# Patient Record
Sex: Female | Born: 1980 | Race: Black or African American | Hispanic: No | Marital: Single | Smoking: Never smoker
Health system: Southern US, Community
[De-identification: ages and names within clinical notes are randomized; demographics above are authoritative.]

## PROBLEM LIST (undated history)

## (undated) DIAGNOSIS — I10 Essential (primary) hypertension: Secondary | ICD-10-CM

## (undated) DIAGNOSIS — R011 Cardiac murmur, unspecified: Secondary | ICD-10-CM

## (undated) HISTORY — DX: Cardiac murmur, unspecified: R01.1

## (undated) HISTORY — PX: GASTRIC BYPASS: SHX52

## (undated) HISTORY — PX: LAPAROSCOPIC GASTRIC SLEEVE RESECTION: SHX5895

---

## 2011-03-20 ENCOUNTER — Emergency Department (HOSPITAL_BASED_OUTPATIENT_CLINIC_OR_DEPARTMENT_OTHER): Payer: Self-pay

## 2011-03-20 ENCOUNTER — Emergency Department (INDEPENDENT_AMBULATORY_CARE_PROVIDER_SITE_OTHER): Payer: Self-pay

## 2011-03-20 ENCOUNTER — Emergency Department (HOSPITAL_BASED_OUTPATIENT_CLINIC_OR_DEPARTMENT_OTHER)
Admission: EM | Admit: 2011-03-20 | Discharge: 2011-03-21 | Disposition: A | Discharge status: Home / Self Care | Payer: Self-pay | Attending: Emergency Medicine | Admitting: Emergency Medicine

## 2011-03-20 ENCOUNTER — Emergency Department (HOSPITAL_COMMUNITY): Payer: Self-pay

## 2011-03-20 DIAGNOSIS — R079 Chest pain, unspecified: Secondary | ICD-10-CM

## 2011-03-20 LAB — DIFFERENTIAL
Basophils Relative: 0 % (ref 0–1)
Eosinophils Absolute: 0.4 10*3/uL (ref 0.0–0.7)
Eosinophils Relative: 4 % (ref 0–5)
Lymphs Abs: 3.2 10*3/uL (ref 0.7–4.0)
Monocytes Absolute: 0.6 10*3/uL (ref 0.1–1.0)
Monocytes Relative: 7 % (ref 3–12)
Neutrophils Relative %: 49 % (ref 43–77)

## 2011-03-20 LAB — CBC
MCH: 26.6 pg (ref 26.0–34.0)
MCHC: 32.8 g/dL (ref 30.0–36.0)
MCV: 81 fL (ref 78.0–100.0)
Platelets: 341 10*3/uL (ref 150–400)
RDW: 12.8 % (ref 11.5–15.5)

## 2011-03-20 LAB — BASIC METABOLIC PANEL
BUN: 12 mg/dL (ref 6–23)
CO2: 29 mEq/L (ref 19–32)
Calcium: 9 mg/dL (ref 8.4–10.5)
GFR calc non Af Amer: >60 mL/min (ref >60)
Glucose, Bld: 91 mg/dL (ref 70–99)
Sodium: 142 mEq/L (ref 135–145)

## 2011-03-20 LAB — POCT CARDIAC MARKERS
CKMB, poc: <1 ng/mL — ABNORMAL LOW (ref 1.0–8.0)
Troponin i, poc: <0.05 ng/mL (ref 0.00–0.09)

## 2011-03-20 LAB — PREGNANCY, URINE: Preg Test, Ur: NEGATIVE

## 2011-03-21 LAB — POCT CARDIAC MARKERS
CKMB, poc: <1 ng/mL — ABNORMAL LOW (ref 1.0–8.0)
Myoglobin, poc: 45.6 ng/mL (ref 12–200)
Troponin i, poc: <0.05 ng/mL (ref 0.00–0.09)

## 2012-03-10 ENCOUNTER — Encounter (HOSPITAL_BASED_OUTPATIENT_CLINIC_OR_DEPARTMENT_OTHER): Payer: Self-pay | Admitting: *Deleted

## 2012-03-10 ENCOUNTER — Emergency Department (HOSPITAL_BASED_OUTPATIENT_CLINIC_OR_DEPARTMENT_OTHER)
Admission: EM | Admit: 2012-03-10 | Discharge: 2012-03-11 | Disposition: A | Discharge status: Home / Self Care | Payer: Self-pay | Attending: Emergency Medicine | Admitting: Emergency Medicine

## 2012-03-10 DIAGNOSIS — Z9884 Bariatric surgery status: Secondary | ICD-10-CM | POA: Insufficient documentation

## 2012-03-10 DIAGNOSIS — R109 Unspecified abdominal pain: Secondary | ICD-10-CM | POA: Insufficient documentation

## 2012-03-10 DIAGNOSIS — R079 Chest pain, unspecified: Secondary | ICD-10-CM | POA: Insufficient documentation

## 2012-03-10 DIAGNOSIS — R0602 Shortness of breath: Secondary | ICD-10-CM | POA: Insufficient documentation

## 2012-03-10 LAB — URINE MICROSCOPIC-ADD ON

## 2012-03-10 LAB — URINALYSIS, ROUTINE W REFLEX MICROSCOPIC
Glucose, UA: NEGATIVE mg/dL
Ketones, ur: >80 mg/dL — AB
Nitrite: NEGATIVE
Protein, ur: 30 mg/dL — AB
Specific Gravity, Urine: 1.018 (ref 1.005–1.030)
Urobilinogen, UA: 4 mg/dL — ABNORMAL HIGH (ref 0.0–1.0)
pH: 6.5 (ref 5.0–8.0)

## 2012-03-10 NOTE — ED Notes (Signed)
No resp. Distress noted in Pt.

## 2012-03-10 NOTE — ED Notes (Signed)
Sob x 1 week. Gastric bypass April 9th. Mid to upper back pain. Hematuria today. Feels cold. Denies fever or chills.

## 2012-03-10 NOTE — ED Provider Notes (Signed)
This chart was scribed for Cyndra Numbers, MD by Wallis Mart. The patient was seen in room MH11/MH11 and the patient's care was started at 11:52 PM.   CSN: [id number]  Arrival date & time 03/10/12  2202   First MD Initiated Contact with Patient 03/10/12 2334      No chief complaint on file.   (Consider location/radiation/quality/duration/timing/severity/associated sxs/prior treatment) HPI Colleen Moody is a 31 y.o. female who presents to the Emergency Department complaining of sudden onset, intermittent, gradually worsening SOB onset 1 week ago.  Pt had gastric bypass one week ago and the SOB started after. Pt c/o associated pain across her chest yesterday but states that it went away today.Pt c/o hematuria onset today.  Pt c/o associated upper  back pain. Pt c/o chills on her right side.  Pt denies h/o kidney stones. Pt denies coughing, fevers.  Denies h/o blood clots, kidney problems, use of blood thinners. There are no other associated symptoms and no other alleviating or aggravating factors.   History reviewed. No pertinent past medical history.  Past Surgical History  Procedure Date  . Gastric bypass     No family history on file.  History  Substance Use Topics  . Smoking status: Never Smoker   . Smokeless tobacco: Not on file  . Alcohol Use: Yes    OB History    Grav Para Term Preterm Abortions TAB SAB Ect Mult Living                  Review of Systems  Constitutional: Positive for activity change.  HENT: Negative.   Eyes: Negative.   Respiratory: Positive for shortness of breath. Negative for cough.   Cardiovascular: Positive for chest pain.  Gastrointestinal: Positive for abdominal pain.  Genitourinary: Positive for hematuria.       Patient has significant difficulty telling me whether she is having her menstrual period or not but eventually feels that the blood in her urine is menstrual in nature.  Musculoskeletal: Negative.   Skin: Positive for wound.    Neurological: Negative.   Hematological: Negative.   Psychiatric/Behavioral: Negative.   All other systems reviewed and are negative.    10 Systems reviewed and all are negative for acute change except as noted in the HPI.   Allergies  Review of patient's allergies indicates not on file.  Home Medications  No current outpatient prescriptions on file.  BP 150/88  Pulse 101  Temp(Src) 99.1 F (37.3 C) (Oral)  Resp 22  SpO2 100%  Physical Exam  Nursing note and vitals reviewed. Constitutional: She is oriented to person, place, and time. She appears well-developed and well-nourished. No distress.  HENT:  Head: Normocephalic and atraumatic.  Eyes: EOM are normal. Pupils are equal, round, and reactive to light.  Neck: Neck supple. No tracheal deviation present.  Cardiovascular: Normal rate and regular rhythm.   Pulmonary/Chest: Effort normal and breath sounds normal. No respiratory distress.  Abdominal: Soft. Bowel sounds are normal. She exhibits no distension.       laparoscopic scars, clean and dry, in-tact, diffuse and mild tenderness consistent with recent postoperative state  Musculoskeletal: Normal range of motion. She exhibits no edema.       c8 back tenderness  Neurological: She is alert and oriented to person, place, and time. No cranial nerve deficit or sensory deficit. She exhibits normal muscle tone. Coordination normal.  Skin: Skin is warm and dry.  Psychiatric: She has a normal mood and affect. Her behavior is normal.  ED Course  Procedures (including critical care time) DIAGNOSTIC STUDIES: Oxygen Saturation is 100% on room air, normal by my interpretation.     Date: 03/11/2012  Rate: 79  Rhythm: normal sinus rhythm  QRS Axis: normal  Intervals: normal  ST/T Wave abnormalities: normal  Conduction Disutrbances: none  Narrative Interpretation: unremarkable      COORDINATION OF CARE:    Labs Reviewed  URINALYSIS, ROUTINE W REFLEX MICROSCOPIC -  Abnormal; Notable for the following:    Color, Urine AMBER (*) BIOCHEMICALS MAY BE AFFECTED BY COLOR   Hgb urine dipstick LARGE (*)    Bilirubin Urine MODERATE (*)    Ketones, ur >80 (*)    Protein, ur 30 (*)    Urobilinogen, UA 4.0 (*)    Leukocytes, UA SMALL (*)    All other components within normal limits  URINE MICROSCOPIC-ADD ON - Abnormal; Notable for the following:    Bacteria, UA FEW (*)    All other components within normal limits  CBC - Abnormal; Notable for the following:    WBC 10.9 (*)    RBC 3.48 (*)    Hemoglobin 9.4 (*)    HCT 28.1 (*)    All other components within normal limits  DIFFERENTIAL - Abnormal; Notable for the following:    Monocytes Absolute 1.1 (*)    All other components within normal limits  PREGNANCY, URINE  BASIC METABOLIC PANEL   Dg Chest 2 View  03/11/2012  *RADIOLOGY REPORT*  Clinical Data: Shortness of breath.  CHEST - 2 VIEW  Comparison: 03/20/2011  Findings: Heart size is upper normal in stable.  Thoracic aorta and hilar contours are normal.  The trachea is midline.  The left costophrenic angle is blunted, a new finding.  There may be a retrocardiac opacity in the medial left lung base.  The right lung appears clear.  IMPRESSION:  Cannot exclude atelectasis or early airspace disease of the left lung base.  Given blunting of the left costophrenic angle on the frontal projection, a small left pleural effusion cannot be excluded, but is not definite.  Original Report Authenticated By: Britta Mccreedy, M.D.   Ct Angio Chest W/cm &/or Wo Cm  03/11/2012  *RADIOLOGY REPORT*  Clinical Data: Shortness of breath and chest pain; recent gastric bypass surgery.  CT ANGIOGRAPHY CHEST  Technique:  Multidetector CT imaging of the chest using the standard protocol during bolus administration of intravenous contrast. Multiplanar reconstructed images including MIPs were obtained and reviewed to evaluate the vascular anatomy.  Contrast: 80mL OMNIPAQUE IOHEXOL 350 MG/ML  SOLN  Comparison: Chest radiograph performed earlier today at 12:32 a.m.  Findings: There is no evidence of significant pulmonary embolus. Evaluation for pulmonary embolus is suboptimal due to motion artifact and in areas of airspace consolidation.  There is patchy airspace consolidation within the left lower lobe, raising concern for mild pneumonia, and mild atelectasis at the right lower lobe.  Alternatively, this could all reflect atelectasis.  A trace left-sided pleural effusion is seen.  There is no evidence of pneumothorax.  No masses are identified; no abnormal focal contrast enhancement is seen.  The mediastinum is unremarkable in appearance.  No mediastinal lymphadenopathy is seen.  No pericardial effusion identified.  The great vessels are grossly unremarkable in appearance.  No axillary lymphadenopathy is seen.  The visualized portions of the thyroid gland are unremarkable in appearance.  The visualized portions of the liver and spleen are unremarkable.  No acute osseous abnormalities are seen.  IMPRESSION:  1.  No evidence of significant pulmonary embolus. 2.  Patchy airspace consolidation within the left lower lobe, raising concern for mild pneumonia; mild atelectasis at the right lower lobe.  Alternatively, this could all reflect atelectasis. 3.  Trace left-sided pleural effusion seen.  Original Report Authenticated By: Tonia Ghent, M.D.     1. Shortness of breath       MDM  Patient was evaluated by myself. She did have recent surgery and to be at risk for development of pneumonia due to poor respiratory efforts from pain, recent hospitalization. Patient also is at risk for blood clot do to recent surgery. She has no history of this. Patient initially complained of hematuria but was able to report that this was menstrual after returning to the bathroom and checking. Patient did not feel that further evaluation for this is necessary. Patient did have chest x-ray was concerning for possible  left lower lobe airspace disease. CT and geode showed concerning area once again with no signs of pulmonary embolus. Patient denied cough or fevers. I was comfortable with treating her possible infiltrate with Avelox. She was given a dose here and a ten-day course. Patient was again encouraged to use her incentive spirometer at home more frequently. She is only been using it every 2 hours. She was told to use it up to 5-6 times an hour while awake. I do think that atelectasis is somewhat responsible for patient's chest x-ray and CT findings. Patient will followup with her surgeon as previously scheduled. She is welcome to return if she has any other emergent concerns.   I personally performed the services described in this documentation, which was scribed in my presence. The recorded information has been reviewed and considered.            Cyndra Numbers, MD 03/11/12 [id number]

## 2012-03-11 ENCOUNTER — Other Ambulatory Visit (HOSPITAL_BASED_OUTPATIENT_CLINIC_OR_DEPARTMENT_OTHER): Payer: Self-pay

## 2012-03-11 ENCOUNTER — Emergency Department (INDEPENDENT_AMBULATORY_CARE_PROVIDER_SITE_OTHER): Payer: Self-pay

## 2012-03-11 DIAGNOSIS — R918 Other nonspecific abnormal finding of lung field: Secondary | ICD-10-CM

## 2012-03-11 DIAGNOSIS — R0602 Shortness of breath: Secondary | ICD-10-CM

## 2012-03-11 DIAGNOSIS — J9819 Other pulmonary collapse: Secondary | ICD-10-CM

## 2012-03-11 DIAGNOSIS — J90 Pleural effusion, not elsewhere classified: Secondary | ICD-10-CM

## 2012-03-11 LAB — BASIC METABOLIC PANEL
BUN: 9 mg/dL (ref 6–23)
CO2: 27 mEq/L (ref 19–32)
Calcium: 9.3 mg/dL (ref 8.4–10.5)
Chloride: 101 mEq/L (ref 96–112)
Creatinine, Ser: 0.7 mg/dL (ref 0.50–1.10)
Glucose, Bld: 99 mg/dL (ref 70–99)

## 2012-03-11 LAB — CBC
HCT: 28.1 % — ABNORMAL LOW (ref 36.0–46.0)
Hemoglobin: 9.4 g/dL — ABNORMAL LOW (ref 12.0–15.0)
MCV: 80.7 fL (ref 78.0–100.0)
WBC: 10.9 10*3/uL — ABNORMAL HIGH (ref 4.0–10.5)

## 2012-03-11 LAB — DIFFERENTIAL
Eosinophils Relative: 2 % (ref 0–5)
Lymphocytes Relative: 22 % (ref 12–46)
Lymphs Abs: 2.4 10*3/uL (ref 0.7–4.0)
Monocytes Absolute: 1.1 10*3/uL — ABNORMAL HIGH (ref 0.1–1.0)
Monocytes Relative: 10 % (ref 3–12)
Neutro Abs: 7.2 10*3/uL (ref 1.7–7.7)

## 2012-03-11 MED ORDER — IOHEXOL 350 MG/ML SOLN
80.0000 mL | Freq: Once | INTRAVENOUS | Status: AC | PRN
  Administered 2012-03-11: 80 mL via INTRAVENOUS

## 2012-03-11 MED ORDER — MOXIFLOXACIN HCL 400 MG PO TABS
400.0000 mg | ORAL_TABLET | Freq: Once | ORAL | Status: AC
  Administered 2012-03-11: 400 mg via ORAL
  Filled 2012-03-11: qty 1

## 2012-03-11 MED ORDER — MOXIFLOXACIN HCL 400 MG PO TABS: 400.0000 mg | ORAL_TABLET | Freq: Every day | ORAL | Status: AC

## 2012-03-11 MED ORDER — HYDROCODONE-ACETAMINOPHEN 5-325 MG PO TABS
2.0000 | ORAL_TABLET | Freq: Once | ORAL | Status: AC
  Administered 2012-03-11: 2 via ORAL
  Filled 2012-03-11: qty 2

## 2012-03-11 MED ORDER — MORPHINE SULFATE 4 MG/ML IJ SOLN: 4.0000 mg | Freq: Once | INTRAMUSCULAR | Status: DC

## 2012-03-11 NOTE — Discharge Instructions (Signed)
Shortness of Breath Shortness of breath (dyspnea) is the feeling of uneasy breathing. Shortness of breath does not always mean that there is a life-threatening illness. However, shortness of breath requires immediate medical care. CAUSES  Causes for shortness of breath include:  Not enough oxygen in the air (as with high altitudes or with a smoke-filled room).   Short-term (acute) lung disease, including:   Infections such as pneumonia.   Fluid in the lungs, such as heart failure.   A blood clot in the lungs (pulmonary embolism).   Lasting (chronic) lung diseases.   Heart disease (heart attack, angina, heart failure, and others).   Low red blood cells (anemia).   Poor physical fitness. This can cause shortness of breath when you exercise.   Chest or back injuries or stiffness.   Being overweight (obese).   Anxiety. This can make you feel like you are not getting enough air.  DIAGNOSIS  Serious medical problems can usually be found during your physical exam. Many tests may also be done to determine why you are having shortness of breath. Tests include:  Chest X-rays.   Lung function tests.   Blood tests.   Electrocardiography.   Exercise testing.   A cardiac echo.   Imaging scans.  Your caregiver may not be able to find a cause for your shortness of breath after your exam. In this case, it is important to have a follow-up exam with your caregiver as directed.  HOME CARE INSTRUCTIONS   Do not smoke. Smoking is a common cause of shortness of breath. Ask for help to stop smoking.   Avoid being around chemicals that may bother your breathing (paint fumes, dust).   Rest as needed. Slowly resume your usual activities.   If medicines were prescribed, take them as directed for the full length of time directed. This includes oxygen and any inhaled medicines.   Follow up with your caregiver as directed. Waiting to do so or failure to follow up could result in worsening of  your condition and possible disability or death.   Be sure you understand what to do or who to call if your shortness of breath worsens.  SEEK MEDICAL CARE IF:   Your condition does not improve in the time expected.   You have a hard time doing your normal activities even with rest.   You have any side effects or problems with the medicines prescribed.   You develop any new symptoms.  SEEK IMMEDIATE MEDICAL CARE IF:   Your shortness of breath is getting worse.   You feel lightheaded, faint, or develop a cough not controlled with medicines.   You start coughing up blood.   You have pain with breathing.   You have chest pain or pain in your arms, shoulders, or abdomen.   You have a fever.   You are unable to walk up stairs or exercise the way you normally do.   Your symptoms are getting worse.  Document Released: 08/07/2001 Document Revised: 11/01/2011 Document Reviewed: 03/24/2008 [employer] Patient Information 2012 [employer]. 

## 2012-03-11 NOTE — ED Notes (Signed)
Pt. Is in no distress with and IV place in the L FA.  Pt. Reports the IV "hurts".  NS flushed in IV site with no trouble.  Offered to take the IV out and Pt. Said "No".

## 2015-09-18 ENCOUNTER — Encounter (HOSPITAL_BASED_OUTPATIENT_CLINIC_OR_DEPARTMENT_OTHER): Payer: Self-pay | Admitting: *Deleted

## 2015-09-18 ENCOUNTER — Emergency Department (HOSPITAL_BASED_OUTPATIENT_CLINIC_OR_DEPARTMENT_OTHER)
Admission: EM | Admit: 2015-09-18 | Discharge: 2015-09-18 | Disposition: A | Discharge status: Home / Self Care | Payer: No Typology Code available for payment source | Attending: Emergency Medicine | Admitting: Emergency Medicine

## 2015-09-18 DIAGNOSIS — S199XXA Unspecified injury of neck, initial encounter: Secondary | ICD-10-CM | POA: Diagnosis not present

## 2015-09-18 DIAGNOSIS — M62838 Other muscle spasm: Secondary | ICD-10-CM | POA: Diagnosis not present

## 2015-09-18 DIAGNOSIS — Y92410 Unspecified street and highway as the place of occurrence of the external cause: Secondary | ICD-10-CM | POA: Insufficient documentation

## 2015-09-18 DIAGNOSIS — S3992XA Unspecified injury of lower back, initial encounter: Secondary | ICD-10-CM | POA: Diagnosis not present

## 2015-09-18 DIAGNOSIS — Y9389 Activity, other specified: Secondary | ICD-10-CM | POA: Insufficient documentation

## 2015-09-18 DIAGNOSIS — Y998 Other external cause status: Secondary | ICD-10-CM | POA: Diagnosis not present

## 2015-09-18 MED ORDER — CYCLOBENZAPRINE HCL 10 MG PO TABS: 10.0000 mg | ORAL_TABLET | Freq: Two times a day (BID) | ORAL | Status: DC | PRN

## 2015-09-18 NOTE — ED Notes (Signed)
Now c/o rt sided neck pain, also at lower back area.
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
## 2015-09-18 NOTE — Discharge Instructions (Signed)
Heat Therapy Heat therapy can help ease sore, stiff, injured, and tight muscles and joints. Heat relaxes your muscles, which may help ease your pain.  RISKS AND COMPLICATIONS If you have any of the following conditions, do not use heat therapy unless your health care provider has approved:  Poor circulation.  Healing wounds or scarred skin in the area being treated.  Diabetes, heart disease, or high blood pressure.  Not being able to feel (numbness) the area being treated.  Unusual swelling of the area being treated.  Active infections.  Blood clots.  Cancer.  Inability to communicate pain. This may include young children and people who have problems with their brain function (dementia).  Pregnancy. Heat therapy should only be used on old, pre-existing, or long-lasting (chronic) injuries. Do not use heat therapy on new injuries unless directed by your health care provider. HOW TO USE HEAT THERAPY There are several different kinds of heat therapy, including:  Moist heat pack.  Warm water bath.  Hot water bottle.  Electric heating pad.  Heated gel pack.  Heated wrap.  Electric heating pad. Use the heat therapy method suggested by your health care provider. Follow your health care provider's instructions on when and how to use heat therapy. GENERAL HEAT THERAPY RECOMMENDATIONS  Do not sleep while using heat therapy. Only use heat therapy while you are awake.  Your skin may turn pink while using heat therapy. Do not use heat therapy if your skin turns red.  Do not use heat therapy if you have new pain.  High heat or long exposure to heat can cause burns. Be careful when using heat therapy to avoid burning your skin.  Do not use heat therapy on areas of your skin that are already irritated, such as with a rash or sunburn. SEEK MEDICAL CARE IF:  You have blisters, redness, swelling, or numbness.  You have new pain.  Your pain is worse. MAKE SURE  YOU:  Understand these instructions.  Will watch your condition.  Will get help right away if you are not doing well or get worse.   This information is not intended to replace advice given to you by your health care provider. Make sure you discuss any questions you have with your health care provider.   Document Released: 02/04/2012 Document Revised: 12/03/2014 Document Reviewed: 01/05/2014 Elsevier Interactive Patient Education 2016 [employer].  Technical brewer It is common to have multiple bruises and sore muscles after a motor vehicle collision (MVC). These tend to feel worse for the first 24 hours. You may have the most stiffness and soreness over the first several hours. You may also feel worse when you wake up the first morning after your collision. After this point, you will usually begin to improve with each day. The speed of improvement often depends on the severity of the collision, the number of injuries, and the location and nature of these injuries. HOME CARE INSTRUCTIONS  Put ice on the injured area.  Put ice in a plastic bag.  Place a towel between your skin and the bag.  Leave the ice on for 15-20 minutes, 3-4 times a day, or as directed by your health care provider.  Drink enough fluids to keep your urine clear or pale yellow. Do not drink alcohol.  Take a warm shower or bath once or twice a day. This will increase blood flow to sore muscles.  You may return to activities as directed by your caregiver. Be careful when lifting,  as this may aggravate neck or back pain.  Only take over-the-counter or prescription medicines for pain, discomfort, or fever as directed by your caregiver. Do not use aspirin. This may increase bruising and bleeding. SEEK IMMEDIATE MEDICAL CARE IF:  You have numbness, tingling, or weakness in the arms or legs.  You develop severe headaches not relieved with medicine.  You have severe neck pain, especially tenderness in the  middle of the back of your neck.  You have changes in bowel or bladder control.  There is increasing pain in any area of the body.  You have shortness of breath, light-headedness, dizziness, or fainting.  You have chest pain.  You feel sick to your stomach (nauseous), throw up (vomit), or sweat.  You have increasing abdominal discomfort.  There is blood in your urine, stool, or vomit.  You have pain in your shoulder (shoulder strap areas).  You feel your symptoms are getting worse. MAKE SURE YOU:  Understand these instructions.  Will watch your condition.  Will get help right away if you are not doing well or get worse.   This information is not intended to replace advice given to you by your health care provider. Make sure you discuss any questions you have with your health care provider.   Return to the emergency department if you expands worsening of your symptoms, bowel or bladder incontinence, numbness or tingling in both extremities, fever. Apply ice to the affected area. Take ibuprofen as needed for pain

## 2017-09-15 NOTE — Progress Notes (Addendum)
[facility] [address], [address] [place], [place] [address] [phone]  Date:  09/16/2017   Name:  Colleen Moody   DOB:  1981/07/05   MRN:  [id number]  PCP:  Colleen Cables, MD    Chief Complaint: Establish Care   History of Present Illness:  Carlia Moody is a 36 y.o. very pleasant female patient who presents with the following:  Here today as a new patient to establish care- She is looking for a PCP History of gastric bypass surgery She does have GYN care- Dr. Allena Moody  She has a 54 yo son.  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 She lost a lot of weight, but then got pregnant and also used depo- she gained back some of the weight she had lost She has an IUD now instead of depo and prefers this  She is otherwise generally in good health She does have occasional migraine headache, seem to be related to hot weather  She has noted some earaches lately as well, and some ST Also some sneezing No rx meds used Flu shot done at work  No recent labs done- she is not fasting today but would like to go ahead and get labs done today  BP Readings from Last 3 Encounters:  09/16/17 (!) 136/94  09/18/15 124/76  03/11/12 104/77   BP at Derm in December was 134/82  She got down to about 190 after her bypass but has gained back about 50 lbs now  She is frustrated by her weight and cannot seem to lose.  She would like to try a medication for this.  Avoid phentermine as her BP is a bit high today.  She would like to try belviq-discussed use of this medication. She will see what her insurance covers We also wonder if she may have OSA- she does snore, and does not feel like she rests that well at  night   There are no active problems to display for this patient.   Past Medical History:  Diagnosis Date  . Heart murmur     Past Surgical History:  Procedure Laterality Date  . CESAREAN SECTION    . GASTRIC BYPASS      Social History  Substance Use Topics  . Smoking status: Never Smoker  . Smokeless tobacco: Not on file  . Alcohol use Yes    Family History  Problem Relation Age of Onset  . Hypertension Mother   . Diabetes Father   . Hypertension Father     No Known Allergies  Medication list has been reviewed and updated.  No current outpatient prescriptions on file prior to visit.   No current facility-administered medications on file prior to visit.     Review of Systems:  As per HPI- otherwise negative. No fever or chills No CP or SOB No rash No nausea, vomiting or diarrhea    Physical Examination: Vitals:   09/16/17 1408 09/16/17 1426  BP: (!) 139/96 (!) 136/94  Pulse: 65   Temp: 98.2 F (36.8 C)   SpO2: 100%    Vitals:   09/16/17 1408  Weight: 238 lb 12.8  oz (108.3 kg)  Height: 5\' 4"  (1.626 m)   Body mass index is 40.99 kg/m. Ideal Body Weight: Weight in (lb) to have BMI = 25: 145.3  GEN: WDWN, NAD, Non-toxic, A & O x 3, obese, otherwise looks well HEENT: Atraumatic, Normocephalic. Neck supple. No masses, No LAD.  Bilateral TM wnl, oropharynx normal.  PEERL,EOMI.   Ears and Nose: No external deformity. CV: RRR, No M/G/R. No JVD. No thrill. No extra heart sounds. PULM: CTA B, no wheezes, crackles, rhonchi. No retractions. No resp. distress. No accessory muscle use. ABD: S, NT, ND, +BS. No rebound. No HSM. EXTR: No c/c/e NEURO Normal gait.  PSYCH: Normally interactive. Conversant. Not depressed or anxious appearing.  Calm demeanor.    Assessment and Plan: Obesity without serious comorbidity, unspecified classification, unspecified obesity type - Plan: Lorcaserin HCl 10 MG TABS  Screening for deficiency anemia - Plan:  CBC  Screening for diabetes mellitus - Plan: Comprehensive metabolic panel, Hemoglobin A1c  Screening for hyperlipidemia - Plan: Lipid panel  Weight gain - Plan: TSH, Ambulatory referral to Neurology  Snoring - Plan: Ambulatory referral to Neurology  Borderline blood pressure  Here today to establish care and discuss a few concerns She is obese- this is her biggest worry right now. Will check her thyroid and other labs, refer for a sleep study, and plan to try belviq if her insurance will cover See patient instructions for more details.   Will plan further follow- up pending labs. She plans to monitor her BP at work and will let me know if running higher than 130/85 on a regular basis    Signed Abbe AmsterdamJessica Felicidad Sugarman, MD  Received her labs 10/23-  Results for orders placed or performed in visit on 09/16/17  CBC  Result Value Ref Range   WBC 6.8 4.0 - 10.5 K/uL   RBC 4.55 3.87 - 5.11 Mil/uL   Platelets 327.0 150.0 - 400.0 K/uL   Hemoglobin 12.4 12.0 - 15.0 g/dL   HCT 16.138.8 09.636.0 - 04.546.0 %   MCV 85.5 78.0 - 100.0 fl   MCHC 31.8 30.0 - 36.0 g/dL   RDW 40.913.4 81.111.5 - 91.415.5 %  Comprehensive metabolic panel  Result Value Ref Range   Sodium 140 135 - 145 mEq/L   Potassium 4.2 3.5 - 5.1 mEq/L   Chloride 104 96 - 112 mEq/L   CO2 31 19 - 32 mEq/L   Glucose, Bld 84 70 - 99 mg/dL   BUN 12 6 - 23 mg/dL   Creatinine, Ser 7.820.76 0.40 - 1.20 mg/dL   Total Bilirubin 0.2 0.2 - 1.2 mg/dL   Alkaline Phosphatase 53 39 - 117 U/L   AST 14 0 - 37 U/L   ALT 12 0 - 35 U/L   Total Protein 7.2 6.0 - 8.3 g/dL   Albumin 4.1 3.5 - 5.2 g/dL   Calcium 9.2 8.4 - 95.610.5 mg/dL   GFR 213.08109.90 >65.78>60.00 mL/min  Hemoglobin A1c  Result Value Ref Range   Hgb A1c MFr Bld 5.6 4.6 - 6.5 %  Lipid panel  Result Value Ref Range   Cholesterol 197 0 - 200 mg/dL   Triglycerides 469.6141.0 0.0 - 149.0 mg/dL   HDL 29.5246.10 >84.13>39.00 mg/dL   VLDL 24.428.2 0.0 - 01.040.0 mg/dL   LDL Cholesterol 272123 (H) 0 - 99 mg/dL   Total CHOL/HDL Ratio 4    NonHDL  150.70   TSH  Result Value Ref Range   TSH 1.19 0.35 - 4.50 uIU/mL  Message to pt

## 2017-09-16 ENCOUNTER — Encounter: Payer: Self-pay | Admitting: Family Medicine

## 2017-09-16 ENCOUNTER — Ambulatory Visit (INDEPENDENT_AMBULATORY_CARE_PROVIDER_SITE_OTHER): Payer: PRIVATE HEALTH INSURANCE | Admitting: Family Medicine

## 2017-09-16 VITALS — BP 136/94 | HR 65 | Temp 98.2°F | Ht 64.0 in | Wt 238.8 lb

## 2017-09-16 DIAGNOSIS — R0683 Snoring: Secondary | ICD-10-CM | POA: Diagnosis not present

## 2017-09-16 DIAGNOSIS — R030 Elevated blood-pressure reading, without diagnosis of hypertension: Secondary | ICD-10-CM | POA: Diagnosis not present

## 2017-09-16 DIAGNOSIS — R635 Abnormal weight gain: Secondary | ICD-10-CM | POA: Diagnosis not present

## 2017-09-16 DIAGNOSIS — E669 Obesity, unspecified: Secondary | ICD-10-CM

## 2017-09-16 DIAGNOSIS — Z130 Encounter for screening for diseases of the blood and blood-forming organs and certain disorders involving the immune mechanism: Secondary | ICD-10-CM

## 2017-09-16 DIAGNOSIS — Z131 Encounter for screening for diabetes mellitus: Secondary | ICD-10-CM

## 2017-09-16 DIAGNOSIS — Z13220 Encounter for screening for lipoid disorders: Secondary | ICD-10-CM | POA: Diagnosis not present

## 2017-09-16 MED ORDER — LORCASERIN HCL 10 MG PO TABS: ORAL_TABLET | ORAL | 5 refills | Status: AC

## 2017-09-16 NOTE — Patient Instructions (Signed)
It was very nice to see you today!  Take care and I will be in touch with your labs asap See how much the Belviq rx may cost for you- this medication can help with weight loss.  We will also set you up for a consultation for a sleep test to evaluate for sleep apnea

## 2017-09-17 ENCOUNTER — Encounter: Payer: Self-pay | Admitting: Family Medicine

## 2017-09-17 LAB — LIPID PANEL
CHOLESTEROL: 197 mg/dL (ref 0–200)
HDL: 46.1 mg/dL (ref >39.00)
LDL Cholesterol: 123 mg/dL — ABNORMAL HIGH (ref 0–99)
NONHDL: 150.7
Total CHOL/HDL Ratio: 4
Triglycerides: 141 mg/dL (ref 0.0–149.0)
VLDL: 28.2 mg/dL (ref 0.0–40.0)

## 2017-09-17 LAB — CBC
HCT: 38.8 % (ref 36.0–46.0)
Hemoglobin: 12.4 g/dL (ref 12.0–15.0)
MCHC: 31.8 g/dL (ref 30.0–36.0)
MCV: 85.5 fl (ref 78.0–100.0)
Platelets: 327 10*3/uL (ref 150.0–400.0)
RBC: 4.55 Mil/uL (ref 3.87–5.11)
RDW: 13.4 % (ref 11.5–15.5)
WBC: 6.8 10*3/uL (ref 4.0–10.5)

## 2017-09-17 LAB — COMPREHENSIVE METABOLIC PANEL
ALK PHOS: 53 U/L (ref 39–117)
ALT: 12 U/L (ref 0–35)
AST: 14 U/L (ref 0–37)
Albumin: 4.1 g/dL (ref 3.5–5.2)
BILIRUBIN TOTAL: 0.2 mg/dL (ref 0.2–1.2)
BUN: 12 mg/dL (ref 6–23)
CALCIUM: 9.2 mg/dL (ref 8.4–10.5)
CO2: 31 mEq/L (ref 19–32)
CREATININE: 0.76 mg/dL (ref 0.40–1.20)
Chloride: 104 mEq/L (ref 96–112)
GFR: 109.9 mL/min (ref >60.00)
Glucose, Bld: 84 mg/dL (ref 70–99)
Potassium: 4.2 mEq/L (ref 3.5–5.1)
Sodium: 140 mEq/L (ref 135–145)
TOTAL PROTEIN: 7.2 g/dL (ref 6.0–8.3)

## 2017-09-17 LAB — TSH: TSH: 1.19 u[IU]/mL (ref 0.35–4.50)

## 2017-09-17 LAB — HEMOGLOBIN A1C: HEMOGLOBIN A1C: 5.6 % (ref 4.6–6.5)

## 2017-12-12 ENCOUNTER — Encounter: Payer: Self-pay | Admitting: Family Medicine

## 2017-12-29 ENCOUNTER — Encounter: Payer: Self-pay | Admitting: Family Medicine

## 2017-12-30 ENCOUNTER — Encounter: Payer: Self-pay | Admitting: Family Medicine

## 2017-12-31 MED ORDER — LISINOPRIL 5 MG PO TABS: 5.0000 mg | ORAL_TABLET | Freq: Every day | ORAL | 3 refills | Status: AC

## 2017-12-31 NOTE — Addendum Note (Signed)
Addended by: Abbe AmsterdamOPLAND,  C on: 12/31/2017 01:36 PM   Modules accepted: Orders

## 2019-06-15 ENCOUNTER — Emergency Department (HOSPITAL_BASED_OUTPATIENT_CLINIC_OR_DEPARTMENT_OTHER): Payer: Managed Care, Other (non HMO)

## 2019-06-15 ENCOUNTER — Emergency Department (HOSPITAL_BASED_OUTPATIENT_CLINIC_OR_DEPARTMENT_OTHER)
Admission: EM | Admit: 2019-06-15 | Discharge: 2019-06-16 | Disposition: A | Discharge status: Home / Self Care | Payer: Managed Care, Other (non HMO) | Attending: Emergency Medicine | Admitting: Emergency Medicine

## 2019-06-15 ENCOUNTER — Encounter (HOSPITAL_BASED_OUTPATIENT_CLINIC_OR_DEPARTMENT_OTHER): Payer: Self-pay | Admitting: Emergency Medicine

## 2019-06-15 ENCOUNTER — Other Ambulatory Visit: Payer: Self-pay

## 2019-06-15 DIAGNOSIS — R079 Chest pain, unspecified: Secondary | ICD-10-CM | POA: Diagnosis present

## 2019-06-15 DIAGNOSIS — R0789 Other chest pain: Secondary | ICD-10-CM | POA: Insufficient documentation

## 2019-06-15 DIAGNOSIS — I10 Essential (primary) hypertension: Secondary | ICD-10-CM | POA: Diagnosis not present

## 2019-06-15 HISTORY — DX: Essential (primary) hypertension: I10

## 2019-06-15 LAB — PREGNANCY, URINE: Preg Test, Ur: NEGATIVE

## 2019-06-15 NOTE — ED Triage Notes (Signed)
Pt c/o chest pain with pain in left arm x 2 hours.

## 2019-06-16 LAB — CBC
HCT: 40.5 % (ref 36.0–46.0)
Hemoglobin: 12.5 g/dL (ref 12.0–15.0)
MCH: 26.8 pg (ref 26.0–34.0)
MCHC: 30.9 g/dL (ref 30.0–36.0)
MCV: 86.7 fL (ref 80.0–100.0)
Platelets: 319 10*3/uL (ref 150–400)
RBC: 4.67 MIL/uL (ref 3.87–5.11)
RDW: 13.1 % (ref 11.5–15.5)
WBC: 7.8 10*3/uL (ref 4.0–10.5)
nRBC: 0 % (ref 0.0–0.2)

## 2019-06-16 LAB — BASIC METABOLIC PANEL
Anion gap: 8 (ref 5–15)
BUN: 11 mg/dL (ref 6–20)
CO2: 27 mmol/L (ref 22–32)
Calcium: 8.8 mg/dL — ABNORMAL LOW (ref 8.9–10.3)
Chloride: 101 mmol/L (ref 98–111)
Creatinine, Ser: 0.73 mg/dL (ref 0.44–1.00)
GFR calc Af Amer: >60 mL/min (ref >60)
GFR calc non Af Amer: >60 mL/min (ref >60)
Glucose, Bld: 99 mg/dL (ref 70–99)
Potassium: 4 mmol/L (ref 3.5–5.1)
Sodium: 136 mmol/L (ref 135–145)

## 2019-06-16 LAB — TROPONIN I (HIGH SENSITIVITY)
Troponin I (High Sensitivity): 2 ng/L (ref <18)
Troponin I (High Sensitivity): 2 ng/L (ref <18)

## 2019-06-16 MED ORDER — ASPIRIN 81 MG PO CHEW
324.0000 mg | CHEWABLE_TABLET | Freq: Once | ORAL | Status: AC
  Administered 2019-06-16: 324 mg via ORAL
  Filled 2019-06-16: qty 4

## 2019-06-16 MED ORDER — NITROGLYCERIN 0.4 MG SL SUBL: 0.4000 mg | SUBLINGUAL_TABLET | SUBLINGUAL | Status: DC | PRN

## 2019-06-16 NOTE — ED Provider Notes (Signed)
[place] EMERGENCY DEPARTMENT Provider Note   CSN: [id number] Arrival date & time: 06/15/19  2126    History   Chief Complaint Chief Complaint  Patient presents with  . Chest Pain    HPI Colleen Moody is a 38 y.o. female.   The history is provided by the patient.  Chest Pain She has history of hypertension and obesity and comes in because of an episode of left arm pain and chest pain.  She had onset about 7 PM of a numb feeling in her left arm and a pain in the left upper chest which she has difficulty describing.  She rated pain at 7/10.  There is no associated dyspnea, nausea, diaphoresis.  Nothing made the pain better, nothing made it worse.  She did not do anything to treat her symptoms.  She is worried about possible heart problems because her mother had heart disease which started in her 15s.  She denies history of diabetes or hyperlipidemia and she is a non-smoker.  Past Medical History:  Diagnosis Date  . Heart murmur   . Hypertension     Patient Active Problem List   Diagnosis Date Noted  . Weight gain 09/16/2017  . Obesity without serious comorbidity 09/16/2017  . Snoring 09/16/2017  . Borderline blood pressure 09/16/2017    Past Surgical History:  Procedure Laterality Date  . CESAREAN SECTION    . GASTRIC BYPASS       OB History   No obstetric history on file.      Home Medications    Prior to Admission medications   Medication Sig Start Date End Date Taking? Authorizing Provider  levonorgestrel (MIRENA) 20 MCG/24HR IUD 1 each by Intrauterine route once.    [provider]  lisinopril (PRINIVIL,ZESTRIL) 5 MG tablet Take 1 tablet (5 mg total) by mouth daily. 12/31/17   Copland, Gay Filler, MD  Lorcaserin HCl 10 MG TABS Take 10 mg PO BID 09/16/17   Copland, Gay Filler, MD    Family History Family History  Problem Relation Age of Onset  . Hypertension Mother   . Diabetes Father   . Hypertension Father     Social History  Social History   Tobacco Use  . Smoking status: Never Smoker  Substance Use Topics  . Alcohol use: Yes  . Drug use: No     Allergies   Patient has no known allergies.   Review of Systems Review of Systems  Cardiovascular: Positive for chest pain.  All other systems reviewed and are negative.    Physical Exam Updated Vital Signs BP (!) 148/88 (BP Location: Left Arm)   Pulse 62   Temp 98.4 F (36.9 C) (Oral)   Resp 20   Ht 5\' 4"  (1.626 m)   Wt 112 kg   SpO2 100%   BMI 42.40 kg/m   Physical Exam Vitals signs and nursing note reviewed.    Obese 38 year old female, resting comfortably and in no acute distress. Vital signs are significant for borderline elevated blood pressure. Oxygen saturation is 100%, which is normal. Head is normocephalic and atraumatic. PERRLA, EOMI. Oropharynx is clear. Neck is nontender and supple without adenopathy or JVD. Back is nontender and there is no CVA tenderness. Lungs are clear without rales, wheezes, or rhonchi. Chest is nontender. Heart has regular rate and rhythm without murmur. Abdomen is soft, flat, nontender without masses or hepatosplenomegaly and peristalsis is normoactive. Extremities have no cyanosis or edema, full range of motion is  present. Skin is warm and dry without rash. Neurologic: Mental status is normal, cranial nerves are intact, there are no motor or sensory deficits.  ED Treatments / Results  Labs (all labs ordered are listed, but only abnormal results are displayed) Labs Reviewed  BASIC METABOLIC PANEL - Abnormal; Notable for the following components:      Result Value   Calcium 8.8 (*)    All other components within normal limits  CBC  PREGNANCY, URINE  TROPONIN I (HIGH SENSITIVITY)  TROPONIN I (HIGH SENSITIVITY)    EKG EKG Interpretation  Date/Time:  Monday June 15 2019 21:42:59 EDT Ventricular Rate:  66 PR Interval:  142 QRS Duration: 80 QT Interval:  372 QTC Calculation: 389 R Axis:   14  Text Interpretation:  Normal sinus rhythm Normal ECG When compared with ECG of 03/11/2012, No significant change was found Confirmed by Dione BoozeGlick, Kordell Jafri ([id number]) on 06/16/2019 2:07:32 AM   Radiology Dg Chest 2 View  Result Date: 06/15/2019 CLINICAL DATA:  Left-sided chest pain EXAM: CHEST - 2 VIEW COMPARISON:  03/11/2012 FINDINGS: The heart size and mediastinal contours are within normal limits. Both lungs are clear. The visualized skeletal structures are unremarkable. IMPRESSION: No active cardiopulmonary disease. Electronically Signed   By: Alcide CleverMark  Lukens M.D.   On: 06/15/2019 23:47    Procedures Procedures   Medications Ordered in ED Medications  aspirin chewable tablet 324 mg (has no administration in time range)  nitroGLYCERIN (NITROSTAT) SL tablet 0.4 mg (has no administration in time range)     Initial Impression / Assessment and Plan / ED Course  I have reviewed the triage vital signs and the nursing notes.  Pertinent labs & imaging results that were available during my care of the patient were reviewed by me and considered in my medical decision making (see chart for details).  Left arm and chest discomfort of uncertain cause.  Because of family history of premature coronary atherosclerosis, I am concerned about possible cardiac etiology.  Old records are reviewed, and she has no relevant past visits.  Initial ECG is normal.  Troponin is normal x2.  Pain is somewhat less, but still rated at 6/10.  She will be given aspirin and therapeutic trial of nitroglycerin.  Heart score is 3, which puts her at low risk for major adverse cardiac events in the next 6 weeks.  Anticipate discharge with cardiology referral for consideration for outpatient stress testing.  She refused nitroglycerin, states that she is feeling fine and no longer having chest pain.  She is advised to take aspirin daily and is referred to cardiology for outpatient stress testing.  Patient states that her insurance would  require her to see a [facility] cardiologist, advised that I do not have ability to refer her to [facility] but that she would have to make her own appointment.  Return precautions discussed.  Final Clinical Impressions(s) / ED Diagnoses   Final diagnoses:  None    ED Discharge Orders    None       Dione BoozeGlick, Elaina Cara, MD 06/16/19 [phone]

## 2019-06-16 NOTE — Discharge Instructions (Addendum)
Please follow up with a cardiologist to consider outpatient stress testing.  Take aspirin 81 mg once a day.  Return if symptoms are getting worse.

## 2019-06-16 NOTE — ED Notes (Signed)
Pt states not having pain at this time and wants to go home and not take the nitroglycerin. MD informed

## 2019-06-16 NOTE — ED Notes (Signed)
ED Provider at bedside. 

## 2020-05-12 ENCOUNTER — Emergency Department (HOSPITAL_BASED_OUTPATIENT_CLINIC_OR_DEPARTMENT_OTHER): Payer: Medicaid Other

## 2020-05-12 ENCOUNTER — Other Ambulatory Visit: Payer: Self-pay

## 2020-05-12 ENCOUNTER — Encounter (HOSPITAL_BASED_OUTPATIENT_CLINIC_OR_DEPARTMENT_OTHER): Payer: Self-pay | Admitting: Emergency Medicine

## 2020-05-12 ENCOUNTER — Emergency Department (HOSPITAL_BASED_OUTPATIENT_CLINIC_OR_DEPARTMENT_OTHER)
Admission: EM | Admit: 2020-05-12 | Discharge: 2020-05-12 | Disposition: A | Discharge status: Home / Self Care | Payer: Medicaid Other | Attending: Emergency Medicine | Admitting: Emergency Medicine

## 2020-05-12 DIAGNOSIS — G43909 Migraine, unspecified, not intractable, without status migrainosus: Secondary | ICD-10-CM

## 2020-05-12 DIAGNOSIS — Z79899 Other long term (current) drug therapy: Secondary | ICD-10-CM | POA: Diagnosis not present

## 2020-05-12 DIAGNOSIS — R079 Chest pain, unspecified: Secondary | ICD-10-CM

## 2020-05-12 DIAGNOSIS — I10 Essential (primary) hypertension: Secondary | ICD-10-CM | POA: Diagnosis not present

## 2020-05-12 LAB — BASIC METABOLIC PANEL
Anion gap: 10 (ref 5–15)
BUN: 11 mg/dL (ref 6–20)
CO2: 27 mmol/L (ref 22–32)
Calcium: 8.7 mg/dL — ABNORMAL LOW (ref 8.9–10.3)
Chloride: 101 mmol/L (ref 98–111)
Creatinine, Ser: 0.86 mg/dL (ref 0.44–1.00)
GFR calc Af Amer: >60 mL/min (ref >60)
GFR calc non Af Amer: >60 mL/min (ref >60)
Glucose, Bld: 96 mg/dL (ref 70–99)
Potassium: 4 mmol/L (ref 3.5–5.1)
Sodium: 138 mmol/L (ref 135–145)

## 2020-05-12 LAB — CBC
HCT: 39.9 % (ref 36.0–46.0)
Hemoglobin: 12.3 g/dL (ref 12.0–15.0)
MCH: 27.1 pg (ref 26.0–34.0)
MCHC: 30.8 g/dL (ref 30.0–36.0)
MCV: 87.9 fL (ref 80.0–100.0)
Platelets: 306 10*3/uL (ref 150–400)
RBC: 4.54 MIL/uL (ref 3.87–5.11)
RDW: 12.8 % (ref 11.5–15.5)
WBC: 6.6 10*3/uL (ref 4.0–10.5)
nRBC: 0 % (ref 0.0–0.2)

## 2020-05-12 LAB — TROPONIN I (HIGH SENSITIVITY): Troponin I (High Sensitivity): <2 ng/L (ref <18)

## 2020-05-12 MED ORDER — ACETAMINOPHEN 500 MG PO TABS
1000.0000 mg | ORAL_TABLET | Freq: Once | ORAL | Status: AC
  Administered 2020-05-12: 1000 mg via ORAL
  Filled 2020-05-12: qty 2

## 2020-05-12 MED ORDER — KETOROLAC TROMETHAMINE 30 MG/ML IJ SOLN
30.0000 mg | Freq: Once | INTRAMUSCULAR | Status: AC
  Administered 2020-05-12: 30 mg via INTRAVENOUS
  Filled 2020-05-12: qty 1

## 2020-05-12 MED ORDER — NITROGLYCERIN 0.4 MG SL SUBL: 0.4000 mg | SUBLINGUAL_TABLET | SUBLINGUAL | Status: DC | PRN

## 2020-05-12 NOTE — ED Triage Notes (Signed)
Intermittent chest pain last night every 5-10 min, no pain right now. States her L shoulder feels numb.

## 2020-05-12 NOTE — ED Provider Notes (Signed)
[facility] EM[facility] DEPARTMENT Provider Note   CSN: [id number] Arrival date & time: 05/12/20  0745     History Chief Complaint  Patient presents with  . Chest Pain    Colleen Moody is a 39 y.o. female.  HPI     Yesterday woke up with headache/migraine, thought maybe sinuses and tried taking sinus medicine medication and ibuprofen. 8 hr  Last night began to have chest pain, chest pain with left arm numbness This morning both chest pain and numbness off and on Now tingling went away, but 15 minutes ago did have some chest pain At this moment having mild chest pain, no tingling, they have been rapidly coming and going this AM Feels like left arm and hand numb Chest pain ache in center of chest, like gas, across chest, no radiation, middle to right side  Headache now is a 3, yesterday woke up and it was there 10/10, no n/v, sensitivity to light and sound Hx of migraines Saw neurologist on Friday, gave ibuprofen rx for migraine Fatigue  Denies focal weakness, difficulty talking or walking, visual changes or facial droop.    At work BP was 142/108  No dyspnea, nausea, vomiting, sweating, dizziness Did feel lightheaded  No cough, fevers  Htn, hasn't taken it in a few days (was having difficulty sleeping so stopped taking it a few days ago)  No fam hx of aneurysm or early heart disease  No smoking nor drugs, occ etoh Past Medical History:  Diagnosis Date  . Heart murmur   . Hypertension     Patient Active Problem List   Diagnosis Date Noted  . Weight gain 09/16/2017  . Obesity without serious comorbidity 09/16/2017  . Snoring 09/16/2017  . Borderline blood pressure 09/16/2017    Past Surgical History:  Procedure Laterality Date  . CESAREAN SECTION    . GASTRIC BYPASS       OB History   No obstetric history on file.     Family History  Problem Relation Age of Onset  . Hypertension Mother   . Diabetes Father   . Hypertension Father      Social History   Tobacco Use  . Smoking status: Never Smoker  . Smokeless tobacco: Never Used  Substance Use Topics  . Alcohol use: Yes  . Drug use: No    Home Medications Prior to Admission medications   Medication Sig Start Date End Date Taking? Authorizing Provider  levonorgestrel (MIRENA) 20 MCG/24HR IUD 1 each by Intrauterine route once.    [provider]  lisinopril (PRINIVIL,ZESTRIL) 5 MG tablet Take 1 tablet (5 mg total) by mouth daily. 12/31/17   Copland, Gay Filler, MD  Lorcaserin HCl 10 MG TABS Take 10 mg PO BID 09/16/17   Copland, Gay Filler, MD    Allergies    Patient has no known allergies.  Review of Systems   Review of Systems  Constitutional: Positive for fatigue. Negative for fever.  HENT: Negative for sore throat.   Eyes: Negative for visual disturbance.  Respiratory: Negative for cough and shortness of breath.   Cardiovascular: Positive for chest pain.  Gastrointestinal: Negative for abdominal pain, diarrhea, nausea and vomiting.  Genitourinary: Negative for difficulty urinating.  Musculoskeletal: Negative for back pain and neck pain.  Skin: Negative for rash.  Neurological: Positive for numbness and headaches. Negative for dizziness, syncope, facial asymmetry, speech difficulty and weakness.    Physical Exam Updated Vital Signs BP 135/82   Pulse (!) 57  Temp 98.5 F (36.9 C) (Oral)   Resp 16   Ht 5\' 4"  (1.626 m)   Wt 111.7 kg   SpO2 100%   BMI 42.26 kg/m   Physical Exam Vitals and nursing note reviewed.  Constitutional:      General: She is not in acute distress.    Appearance: Normal appearance. She is well-developed. She is not ill-appearing or diaphoretic.  HENT:     Head: Normocephalic and atraumatic.  Eyes:     General: No visual field deficit.    Extraocular Movements: Extraocular movements intact.     Conjunctiva/sclera: Conjunctivae normal.     Pupils: Pupils are equal, round, and reactive to light.  Cardiovascular:      Rate and Rhythm: Normal rate and regular rhythm.     Pulses: Normal pulses.     Heart sounds: Normal heart sounds. No murmur heard.  No friction rub. No gallop.   Pulmonary:     Effort: Pulmonary effort is normal. No respiratory distress.     Breath sounds: Normal breath sounds. No wheezing or rales.  Abdominal:     General: There is no distension.     Palpations: Abdomen is soft.     Tenderness: There is no abdominal tenderness. There is no guarding.  Musculoskeletal:        General: No swelling or tenderness.     Cervical back: Normal range of motion.  Skin:    General: Skin is warm and dry.     Findings: No erythema or rash.  Neurological:     General: No focal deficit present.     Mental Status: She is alert and oriented to person, place, and time.     GCS: GCS eye subscore is 4. GCS verbal subscore is 5. GCS motor subscore is 6.     Cranial Nerves: No cranial nerve deficit, dysarthria or facial asymmetry.     Sensory: No sensory deficit.     Motor: No weakness or tremor.     Coordination: Coordination normal. Finger-Nose-Finger Test normal.     Gait: Gait normal.     ED Results / Procedures / Treatments   Labs (all labs ordered are listed, but only abnormal results are displayed) Labs Reviewed  BASIC METABOLIC PANEL - Abnormal; Notable for the following components:      Result Value   Calcium 8.7 (*)    All other components within normal limits  CBC  TROPONIN I (HIGH SENSITIVITY)  TROPONIN I (HIGH SENSITIVITY)    EKG EKG Interpretation  Date/Time:  Thursday May 12 2020 07:57:53 EDT Ventricular Rate:  67 PR Interval:    QRS Duration: 99 QT Interval:  383 QTC Calculation: 405 R Axis:   18 Text Interpretation: Sinus rhythm Abnormal R-wave progression, early transition Nonspecific changes since prior ECG Confirmed by [id number] (Alvira Monday) on 05/12/2020 8:13:52 AM   Radiology DG Chest 2 View  Result Date: 05/12/2020 CLINICAL DATA:  Chest pain. EXAM:  CHEST - 2 VIEW COMPARISON:  June 15, 2019. FINDINGS: The heart size and mediastinal contours are within normal limits. Both lungs are clear. No pneumothorax or pleural effusion is noted. The visualized skeletal structures are unremarkable. IMPRESSION: No active cardiopulmonary disease. Electronically Signed   By: June 17, 2019 M.D.   On: 05/12/2020 08:38   CT Head Wo Contrast  Result Date: 05/12/2020 CLINICAL DATA:  Headaches since yesterday.  Intermittent tingling. EXAM: CT HEAD WITHOUT CONTRAST TECHNIQUE: Contiguous axial images were obtained from the base of the  skull through the vertex without intravenous contrast. COMPARISON:  None. FINDINGS: Brain: There is no evidence of acute infarct, intracranial hemorrhage, mass, midline shift, or extra-axial fluid collection. The ventricles and sulci are normal. Vascular: No hyperdense vessel. Skull: No fracture suspicious osseous lesion. Sinuses/Orbits: Minimal left sphenoid and left ethmoid sinus mucosal thickening. Clear mastoid air cells. Unremarkable included orbits. Other: None. IMPRESSION: Negative head CT. Electronically Signed   By: Sebastian Ache M.D.   On: 05/12/2020 09:14    Procedures Procedures (including critical care time)  Medications Ordered in ED Medications  acetaminophen (TYLENOL) tablet 1,000 mg (1,000 mg Oral Given 05/12/20 0839)  ketorolac (TORADOL) 30 MG/ML injection 30 mg (30 mg Intravenous Given 05/12/20 0940)    ED Course  I have reviewed the triage vital signs and the nursing notes.  Pertinent labs & imaging results that were available during my care of the patient were reviewed by me and considered in my medical decision making (see chart for details).    MDM Rules/Calculators/A&P                           39yo female with history of hypertension presents with concern for headache and chest pain.   Given headache with left arm tingling, screening head CT done showing no acute abnormalities.  Tingling has been  intermittent and timing of it is not consistent with TIA. No sign of acute arterial occlusion, normal pulses bilateral UE and LE and doubt dissection. Exam normal now and have low suspicion for CVA.  Possible headache with tingling may represent migraine as pt with history of migraines.    Differential diagnosis for chest pain includes pulmonary embolus, dissection, pneumothorax, pneumonia, ACS, myocarditis, pericarditis.  EKG was done and evaluate by me and showed no acute ST changes and no signs of pericarditis. Chest x-ray was done and evaluated by me and radiology and showed no sign of pneumonia or pneumothorax. Have low suspicion for PE.  Patient is low risk HEART score and troponin negative after 6hr of symptoms.  Do not feel history or exam are consistent with aortic dissection.   Recommend follow up with PCP and neurology regarding symptoms.    Final Clinical Impression(s) / ED Diagnoses Final diagnoses:  Migraine without status migrainosus, not intractable, unspecified migraine type  Chest pain, unspecified type    Rx / DC Orders ED Discharge Orders    None       Alvira Monday, MD 05/12/20 2244

## 2021-08-16 ENCOUNTER — Emergency Department (HOSPITAL_BASED_OUTPATIENT_CLINIC_OR_DEPARTMENT_OTHER): Payer: Medicaid Other

## 2021-08-16 ENCOUNTER — Emergency Department (HOSPITAL_BASED_OUTPATIENT_CLINIC_OR_DEPARTMENT_OTHER)
Admission: EM | Admit: 2021-08-16 | Discharge: 2021-08-16 | Disposition: A | Discharge status: Home / Self Care | Payer: Medicaid Other | Attending: Emergency Medicine | Admitting: Emergency Medicine

## 2021-08-16 ENCOUNTER — Other Ambulatory Visit: Payer: Self-pay

## 2021-08-16 ENCOUNTER — Encounter (HOSPITAL_BASED_OUTPATIENT_CLINIC_OR_DEPARTMENT_OTHER): Payer: Self-pay

## 2021-08-16 DIAGNOSIS — Z79899 Other long term (current) drug therapy: Secondary | ICD-10-CM | POA: Insufficient documentation

## 2021-08-16 DIAGNOSIS — I10 Essential (primary) hypertension: Secondary | ICD-10-CM | POA: Diagnosis not present

## 2021-08-16 DIAGNOSIS — R079 Chest pain, unspecified: Secondary | ICD-10-CM

## 2021-08-16 DIAGNOSIS — R519 Headache, unspecified: Secondary | ICD-10-CM

## 2021-08-16 LAB — TROPONIN I (HIGH SENSITIVITY): Troponin I (High Sensitivity): <2 ng/L (ref <18)

## 2021-08-16 LAB — CBC WITH DIFFERENTIAL/PLATELET
Abs Immature Granulocytes: 0.04 10*3/uL (ref 0.00–0.07)
Basophils Absolute: 0 10*3/uL (ref 0.0–0.1)
Basophils Relative: 0 %
Eosinophils Absolute: 0.1 10*3/uL (ref 0.0–0.5)
Eosinophils Relative: 2 %
HCT: 40.3 % (ref 36.0–46.0)
Hemoglobin: 12.9 g/dL (ref 12.0–15.0)
Immature Granulocytes: 1 %
Lymphocytes Relative: 27 %
Lymphs Abs: 2 10*3/uL (ref 0.7–4.0)
MCH: 27.3 pg (ref 26.0–34.0)
MCHC: 32 g/dL (ref 30.0–36.0)
MCV: 85.4 fL (ref 80.0–100.0)
Monocytes Absolute: 0.5 10*3/uL (ref 0.1–1.0)
Monocytes Relative: 7 %
Neutro Abs: 4.9 10*3/uL (ref 1.7–7.7)
Neutrophils Relative %: 63 %
Platelets: 322 10*3/uL (ref 150–400)
RBC: 4.72 MIL/uL (ref 3.87–5.11)
RDW: 13.2 % (ref 11.5–15.5)
WBC: 7.6 10*3/uL (ref 4.0–10.5)
nRBC: 0 % (ref 0.0–0.2)

## 2021-08-16 LAB — PREGNANCY, URINE: Preg Test, Ur: NEGATIVE

## 2021-08-16 LAB — BASIC METABOLIC PANEL
Anion gap: 7 (ref 5–15)
BUN: 17 mg/dL (ref 6–20)
CO2: 28 mmol/L (ref 22–32)
Calcium: 8.9 mg/dL (ref 8.9–10.3)
Chloride: 102 mmol/L (ref 98–111)
Creatinine, Ser: 0.93 mg/dL (ref 0.44–1.00)
GFR, Estimated: >60 mL/min (ref >60)
Glucose, Bld: 96 mg/dL (ref 70–99)
Potassium: 3.5 mmol/L (ref 3.5–5.1)
Sodium: 137 mmol/L (ref 135–145)

## 2021-08-16 LAB — URINALYSIS, ROUTINE W REFLEX MICROSCOPIC
Bilirubin Urine: NEGATIVE
Glucose, UA: NEGATIVE mg/dL
Hgb urine dipstick: NEGATIVE
Ketones, ur: NEGATIVE mg/dL
Leukocytes,Ua: NEGATIVE
Nitrite: NEGATIVE
Protein, ur: NEGATIVE mg/dL
Specific Gravity, Urine: 1.025 (ref 1.005–1.030)
pH: 6 (ref 5.0–8.0)

## 2021-08-16 MED ORDER — KETOROLAC TROMETHAMINE 15 MG/ML IJ SOLN
15.0000 mg | Freq: Once | INTRAMUSCULAR | Status: AC
  Administered 2021-08-16: 15 mg via INTRAVENOUS
  Filled 2021-08-16: qty 1

## 2021-08-16 MED ORDER — DIPHENHYDRAMINE HCL 50 MG/ML IJ SOLN
50.0000 mg | Freq: Once | INTRAMUSCULAR | Status: AC
  Administered 2021-08-16: 50 mg via INTRAVENOUS
  Filled 2021-08-16: qty 1

## 2021-08-16 MED ORDER — SODIUM CHLORIDE 0.9 % IV BOLUS
1000.0000 mL | Freq: Once | INTRAVENOUS | Status: AC
  Administered 2021-08-16: 1000 mL via INTRAVENOUS

## 2021-08-16 MED ORDER — METOCLOPRAMIDE HCL 5 MG/ML IJ SOLN
10.0000 mg | Freq: Once | INTRAMUSCULAR | Status: AC
  Administered 2021-08-16: 10 mg via INTRAVENOUS
  Filled 2021-08-16: qty 2

## 2021-08-16 NOTE — Discharge Instructions (Addendum)
Your workup was overall reassuring in the ED today with negative CT scans of your head and neck.   I would recommend following up with the neurologist you saw last year for further evaluation of your headaches.  Follow up with your PCP regarding your elevated blood pressures at home. Continue taking your blood pressure medication as indicated.   Return to the ED for any new/worsening symptoms

## 2021-08-16 NOTE — ED Notes (Signed)
Pt to ct and xray

## 2021-08-16 NOTE — ED Provider Notes (Signed)
[facility] EMERGENCY DEPARTMENT Provider Note   CSN: [id number] Arrival date & time: 08/16/21  1317     History Chief Complaint  Patient presents with   Headache    Colleen Moody is a 40 y.o. female with Pmhx HTN, obesity, and migraine headaches who presents to the ED today with complaint of gradual onset, constant, sharp, left sided headache x 5 days. Pt also complains of photophobia and phonophobia. She reports she also has left lateral neck pain that is worsened with turning her head to the right side. She states that her blood pressure has been high the past couple of days as well despite taking her Lisinopril daily. Pt went to UC yesterday for same and was provided phenergan and toradol injection. She reports this helped both her blood pressure go down and helped with her neck pain. She states however last night her headache seemed to worsen. She states that this morning she began having left sided chest pain with radiation down her left arm prompting ED visit. She mentions that at [facility] they wanted to have a CT Head and CT C spine done however the place she was sent to could not get it scheduled until next Thursday. She does mention she had "tingling" to her left arm a couple of days ago however this resolved. She states her chest pain has seemed to resolve as well and now she is mostly complaining of a headache. She does report hx of migraines in the past however states it has never been this severe and she has never had other symptoms with it. Per chart review pt was seen in the ED on 05/12/20 for similar complaint of headache, chest pain, and left arm tingling/pain with reassuring work up. It does appear she has also seen neurology in the past for headaches and was provided Ibuprofen for same.   The history is provided by the patient and medical records.   HPI: A 40 year old patient with a history of hypertension and obesity presents for evaluation of chest pain. Initial onset of  pain was approximately 3-6 hours ago. The patient's chest pain is not worse with exertion. The patient's chest pain is middle- or left-sided, is not well-localized, is not described as heaviness/pressure/tightness, is not sharp and does radiate to the arms/jaw/neck. The patient does not complain of nausea and denies diaphoresis. The patient has no history of stroke, has no history of peripheral artery disease, has not smoked in the past 90 days, denies any history of treated diabetes, has no relevant family history of coronary artery disease (first degree relative at less than age 54) and has no history of hypercholesterolemia.   Past Medical History:  Diagnosis Date   Heart murmur    Hypertension     Patient Active Problem List   Diagnosis Date Noted   Weight gain 09/16/2017   Obesity without serious comorbidity 09/16/2017   Snoring 09/16/2017   Borderline blood pressure 09/16/2017    Past Surgical History:  Procedure Laterality Date   CESAREAN SECTION     LAPAROSCOPIC GASTRIC SLEEVE RESECTION       OB History   No obstetric history on file.     Family History  Problem Relation Age of Onset   Hypertension Mother    Diabetes Father    Hypertension Father     Social History   Tobacco Use   Smoking status: Never   Smokeless tobacco: Never  Vaping Use   Vaping Use: Never used  Substance Use  Topics   Alcohol use: Yes    Comment: occ   Drug use: No    Home Medications Prior to Admission medications   Medication Sig Start Date End Date Taking? Authorizing Provider  levonorgestrel (MIRENA) 20 MCG/24HR IUD 1 each by Intrauterine route once.    [provider]  lisinopril (PRINIVIL,ZESTRIL) 5 MG tablet Take 1 tablet (5 mg total) by mouth daily. 12/31/17   Copland, Gwenlyn Found, MD  Lorcaserin HCl 10 MG TABS Take 10 mg PO BID 09/16/17   Copland, Gwenlyn Found, MD    Allergies    Patient has no known allergies.  Review of Systems   Review of Systems  Constitutional:   Negative for chills and fever.  Eyes:  Positive for photophobia. Negative for pain and visual disturbance.  Respiratory:  Negative for cough and shortness of breath.   Cardiovascular:  Positive for chest pain. Negative for palpitations and leg swelling.  Gastrointestinal:  Negative for nausea and vomiting.  Musculoskeletal:  Positive for neck pain. Negative for neck stiffness.  Neurological:  Positive for headaches. Negative for speech difficulty, weakness and numbness.  All other systems reviewed and are negative.  Physical Exam Updated Vital Signs BP (!) 155/104 (BP Location: Right Arm)   Pulse 84   Temp 98.7 F (37.1 C) (Oral)   Resp 20   Ht 5\' 3"  (1.6 m)   Wt 111.6 kg   SpO2 100%   BMI 43.58 kg/m   Physical Exam Vitals and nursing note reviewed.  Constitutional:      Appearance: She is obese. She is not ill-appearing or diaphoretic.     Comments: Laying in darkened room with lights turned off and head underneath the covers  HENT:     Head: Normocephalic and atraumatic.  Eyes:     Extraocular Movements: Extraocular movements intact.     Conjunctiva/sclera: Conjunctivae normal.     Pupils: Pupils are equal, round, and reactive to light.  Neck:     Meningeal: Brudzinski's sign and Kernig's sign absent.  Cardiovascular:     Rate and Rhythm: Normal rate and regular rhythm.     Heart sounds: Normal heart sounds.  Pulmonary:     Effort: Pulmonary effort is normal.     Breath sounds: Normal breath sounds. No wheezing, rhonchi or rales.  Abdominal:     Palpations: Abdomen is soft.     Tenderness: There is no abdominal tenderness.  Musculoskeletal:        General: Normal range of motion.     Cervical back: Normal range of motion and neck supple. No rigidity.  Skin:    General: Skin is warm and dry.  Neurological:     Mental Status: She is alert.     Comments: Alert and oriented to self, place, time and event.   Speech is fluent, clear without dysarthria or dysphasia.    Strength 5/5 in upper/lower extremities   Sensation intact in upper/lower extremities   Normal gait.  Negative Romberg. No pronator drift.  Normal finger-to-nose and feet tapping.  CN I not tested  CN II grossly intact visual fields bilaterally. Did not visualize posterior eye.  CN III, IV, VI PERRLA and EOMs intact bilaterally  CN V Intact sensation to sharp and light touch to the face  CN VII facial movements symmetric  CN VIII not tested  CN IX, X no uvula deviation, symmetric rise of soft palate  CN XI 5/5 SCM and trapezius strength bilaterally  CN XII Midline tongue  protrusion, symmetric L/R movements      ED Results / Procedures / Treatments   Labs (all labs ordered are listed, but only abnormal results are displayed) Labs Reviewed  URINALYSIS, ROUTINE W REFLEX MICROSCOPIC - Abnormal; Notable for the following components:      Result Value   APPearance HAZY (*)    All other components within normal limits  BASIC METABOLIC PANEL  CBC WITH DIFFERENTIAL/PLATELET  PREGNANCY, URINE  TROPONIN I (HIGH SENSITIVITY)    EKG None  Radiology DG Chest 2 View  Result Date: 08/16/2021 CLINICAL DATA:  Hypertension. Migraine headaches. Pain extending into the neck and arms. EXAM: CHEST - 2 VIEW COMPARISON:  Two-view chest x-ray 05/12/2020 FINDINGS: Heart size normal. Lungs are clear. No edema or effusion is present. Lung volumes are low. Axial skeleton is within normal limits. IMPRESSION: 1. Low lung volumes. 2. No acute cardiopulmonary disease. Electronically Signed   By: Marin Roberts M.D.   On: 08/16/2021 15:32   CT Head Wo Contrast  Result Date: 08/16/2021 CLINICAL DATA:  Headache and neck pain with no relief EXAM: CT HEAD WITHOUT CONTRAST CT CERVICAL SPINE WITHOUT CONTRAST TECHNIQUE: Multidetector CT imaging of the head and cervical spine was performed following the standard protocol without intravenous contrast. Multiplanar CT image reconstructions of the cervical  spine were also generated. COMPARISON:  05/12/2020 FINDINGS: CT HEAD FINDINGS Brain: No evidence of acute infarction, hemorrhage, hydrocephalus, extra-axial collection or mass lesion/mass effect. Vascular: No hyperdense vessel or unexpected calcification. Skull: Normal. Negative for fracture or focal lesion. Sinuses/Orbits: Minor ethmoid mucosal thickening. Other sinuses and mastoids are clear. Orbits unremarkable. Other: None. CT CERVICAL SPINE FINDINGS Alignment: Normal alignment. Kyphotic curvature noted appearing positional. Facets are aligned. Skull base and vertebrae: No acute fracture. No primary bone lesion or focal pathologic process. Soft tissues and spinal canal: No prevertebral fluid or swelling. No visible canal hematoma. Disc levels: No significant degenerative process or spondylosis. Patent cervical foramina bilaterally. Preserved vertebral body heights and disc spaces. Upper chest: Negative. Other: None. IMPRESSION: Normal head CT without contrast. No acute cervical spine osseous abnormality, fracture or malalignment. Electronically Signed   By: Judie Petit.  Shick M.D.   On: 08/16/2021 15:20   CT Cervical Spine Wo Contrast  Result Date: 08/16/2021 CLINICAL DATA:  Headache and neck pain with no relief EXAM: CT HEAD WITHOUT CONTRAST CT CERVICAL SPINE WITHOUT CONTRAST TECHNIQUE: Multidetector CT imaging of the head and cervical spine was performed following the standard protocol without intravenous contrast. Multiplanar CT image reconstructions of the cervical spine were also generated. COMPARISON:  05/12/2020 FINDINGS: CT HEAD FINDINGS Brain: No evidence of acute infarction, hemorrhage, hydrocephalus, extra-axial collection or mass lesion/mass effect. Vascular: No hyperdense vessel or unexpected calcification. Skull: Normal. Negative for fracture or focal lesion. Sinuses/Orbits: Minor ethmoid mucosal thickening. Other sinuses and mastoids are clear. Orbits unremarkable. Other: None. CT CERVICAL SPINE  FINDINGS Alignment: Normal alignment. Kyphotic curvature noted appearing positional. Facets are aligned. Skull base and vertebrae: No acute fracture. No primary bone lesion or focal pathologic process. Soft tissues and spinal canal: No prevertebral fluid or swelling. No visible canal hematoma. Disc levels: No significant degenerative process or spondylosis. Patent cervical foramina bilaterally. Preserved vertebral body heights and disc spaces. Upper chest: Negative. Other: None. IMPRESSION: Normal head CT without contrast. No acute cervical spine osseous abnormality, fracture or malalignment. Electronically Signed   By: Judie Petit.  Shick M.D.   On: 08/16/2021 15:20    Procedures Procedures   Medications Ordered in ED Medications  sodium  chloride 0.9 % bolus 1,000 mL (1,000 mLs Intravenous New Bag/Given 08/16/21 1525)  metoCLOPramide (REGLAN) injection 10 mg (10 mg Intravenous Given 08/16/21 1525)  diphenhydrAMINE (BENADRYL) injection 50 mg (50 mg Intravenous Given 08/16/21 1525)  ketorolac (TORADOL) 15 MG/ML injection 15 mg (15 mg Intravenous Given 08/16/21 1546)    ED Course  I have reviewed the triage vital signs and the nursing notes.  Pertinent labs & imaging results that were available during my care of the patient were reviewed by me and considered in my medical decision making (see chart for details).    MDM Rules/Calculators/A&P HEAR Score: 10                         40 year old female who presents to the ED today with complaint of left-sided headache, left-sided neck pain as well as now left-sided chest pain and left arm pain.  Seen at urgent care yesterday for her headache and treated accordingly however does appear they wanted to have a CT head and CT C-spine done with concern for muscle tension causing headache.  She is unable to get this done until next week prompting ED visit today as well as concern for chest pain.  On arrival to the ED patient's blood pressure is 155/104.  She reports she  was told by urgent care to increase her lisinopril from once daily to twice daily which she started last night.  Last took dose this AM.  EKG unchanged from previous.  On my exam she is no focal neurodeficits.  No meningeal signs.  She is lying in a darkened room with her head underneath the blanket.  She has no nystagmus.  Her headache does seem likely consistent with migraine headache with history of same however given that she is positive a CT head and CT C-spine done and she is here today we will plan for same.  She is not having any active chest pain at this time however we will plan for troponin and chest x-ray to rule out any cardiac event.  She has equal pulses bilaterally, doubt dissection at this time.  I, Tru Leopard [employer], personally reviewed and evaluated these images and lab results as part of my medical decision-making.  CXR clear without signs of infiltrate or enlarged cardiac silhouette  CBC without leukocytosis and hgb stable at 12.9.  BMP without electrolyte abnormalities. Creatinine stable at 0.93. No signs of organ damage.  UPT negative U/A without signs of infection or hgb  CT Head and CT C spine negative without acute findings Troponin < 2. Heart score of 3 - given negative troponin do not feel pt requires repeat testing at this time.   On reevaluation pt sleeping and snoring in the room. She is easily arousable - reports headache is improving. She is stable for discharge home at this time. Recommend outpatient follow up with neurology that she saw last week. She is in agreement with plan and stable for discharge home.   This note was prepared using Dragon voice recognition software and may include unintentional dictation errors due to the inherent limitations of voice recognition software.  Final Clinical Impression(s) / ED Diagnoses Final diagnoses:  Bad headache  Nonspecific chest pain  Primary hypertension    Rx / DC Orders ED Discharge Orders     None         Discharge Instructions      Your workup was overall reassuring in the ED today with negative CT  scans of your head and neck.   I would recommend following up with the neurologist you saw last year for further evaluation of your headaches.  Follow up with your PCP regarding your elevated blood pressures at home. Continue taking your blood pressure medication as indicated.   Return to the ED for any new/worsening symptoms        Tanda Rockers, PA-C 08/16/21 1614    Tanda Rockers A, DO 08/16/21 1759

## 2021-08-16 NOTE — ED Notes (Signed)
Pt states feels better.

## 2021-08-16 NOTE — ED Triage Notes (Signed)
Pt c/o HA "left side of my head all the way down to my hand" x 5 days-seen at [facility] yesterday-states she is scheduled "for a head scan next Thursday"-states she is now having Cp x 1 hour-NAD-steady gait

## 2023-04-10 IMAGING — CT CT HEAD W/O CM
4 of 5 series · 17 of 47 positions shown, 19 images · non-contrast
Comparison: 05/12/2020

CLINICAL DATA: Headache and neck pain with no relief

EXAM:
CT HEAD WITHOUT CONTRAST
CT CERVICAL SPINE WITHOUT CONTRAST
TECHNIQUE: Multidetector CT imaging of the head and cervical spine was
performed following the standard protocol without intravenous
contrast. Multiplanar CT image reconstructions of the cervical spine
were also generated.

[Series 2: head 5.0 h30s · axial · 0.43mm/px · z∈[-171,-71]mm · 6 of 29 slices shown, 8 images]
[im 5/29  brain]
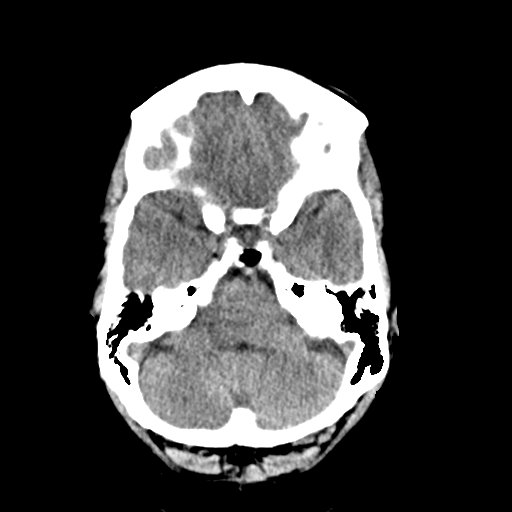
[im 5/29  bone]
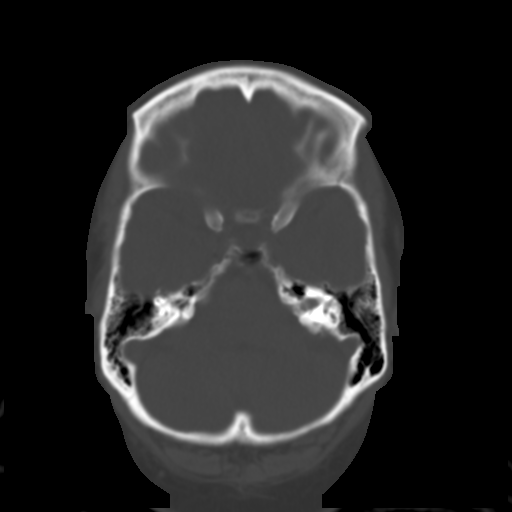
[im 9/29  brain]
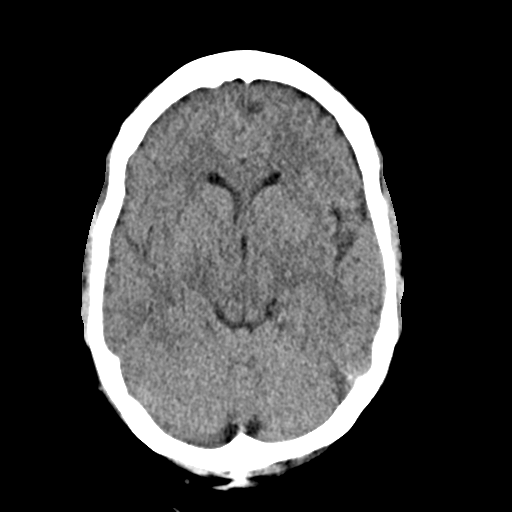
[im 13/29  brain]
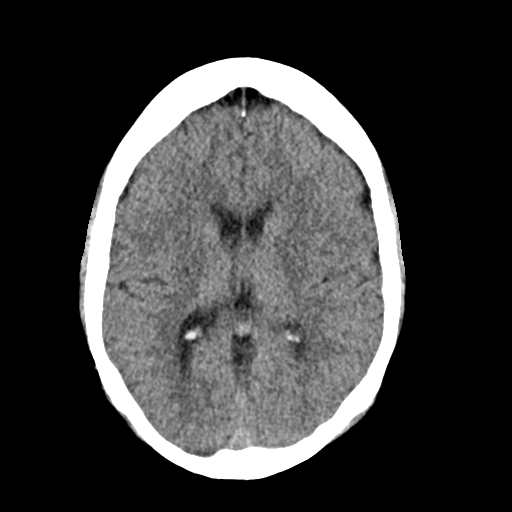
[im 17/29  brain]
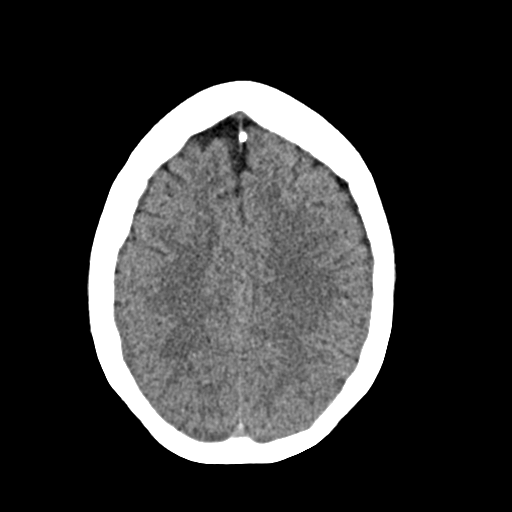
[im 21/29  brain]
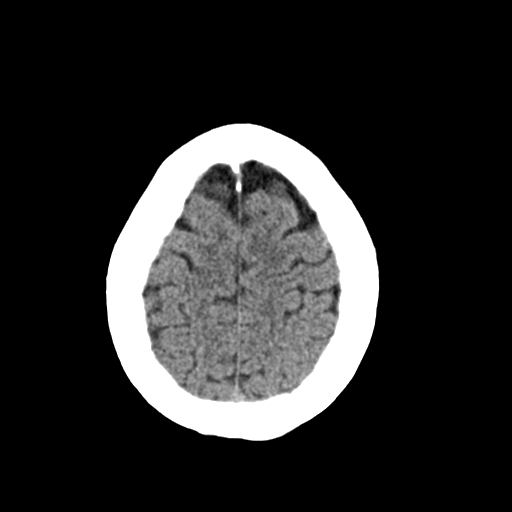
[im 21/29  bone]
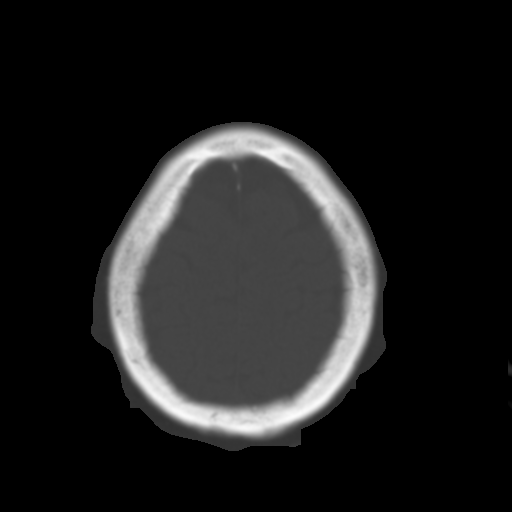
[im 25/29  brain]
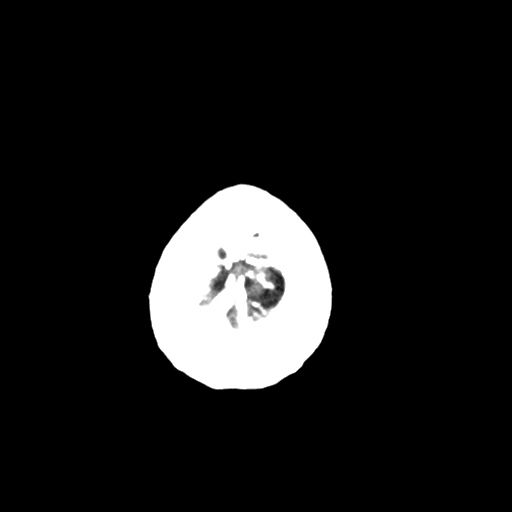

[Series 4: head 3.0 mpr cor · coronal · 0.27mm/px · 3 of 67 slices shown]
[im 23/67  brain]
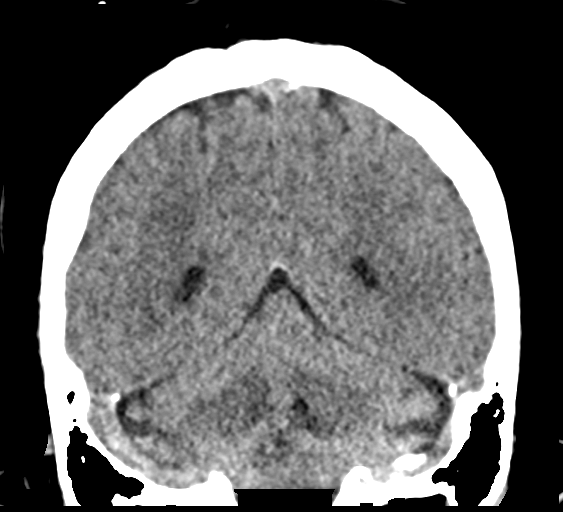
[im 30/67  brain]
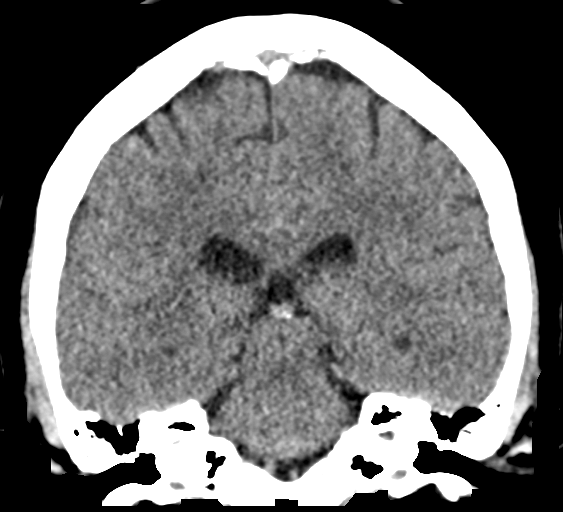
[im 37/67  brain]
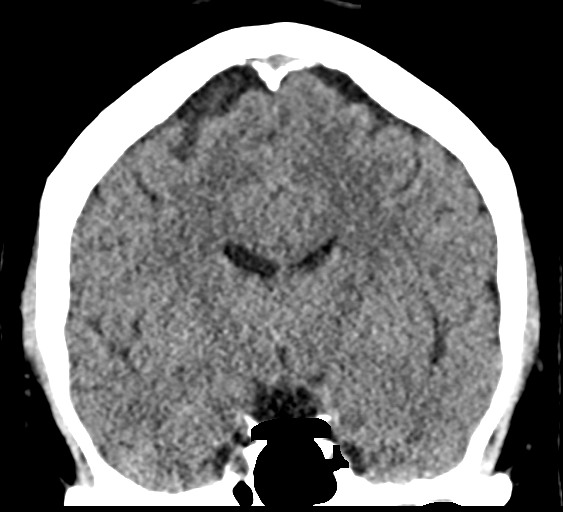

[Series 7: c_spine 2.0 i30s 3 · axial · 0.31mm/px · z∈[-304,-220]mm · 6 of 75 slices shown]
[im 8/75  brain]
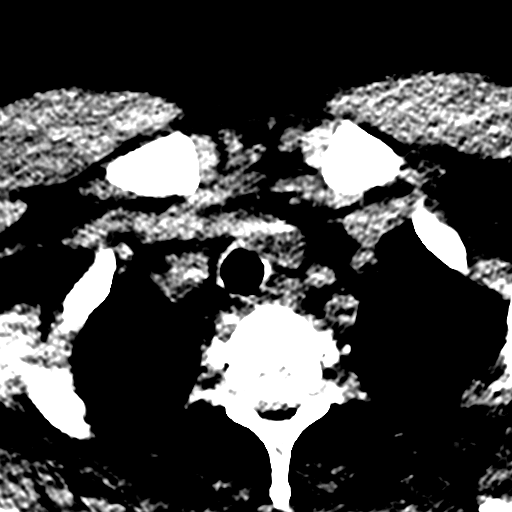
[im 15/75  brain]
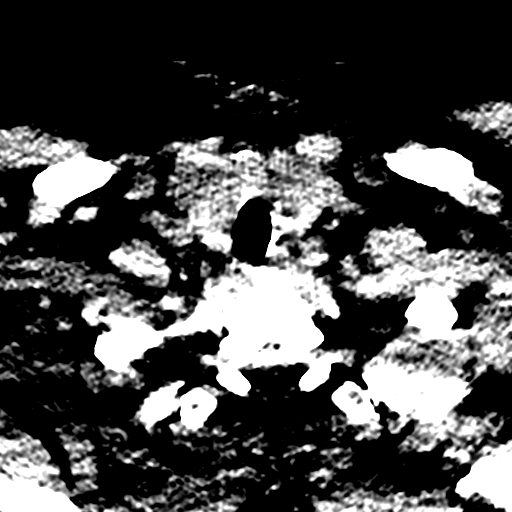
[im 25/75  brain]
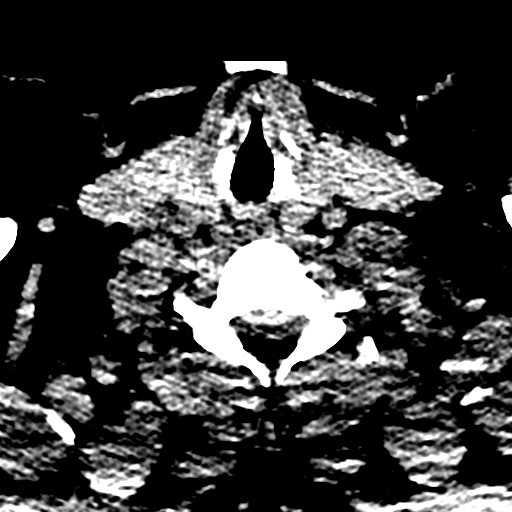
[im 32/75  brain]
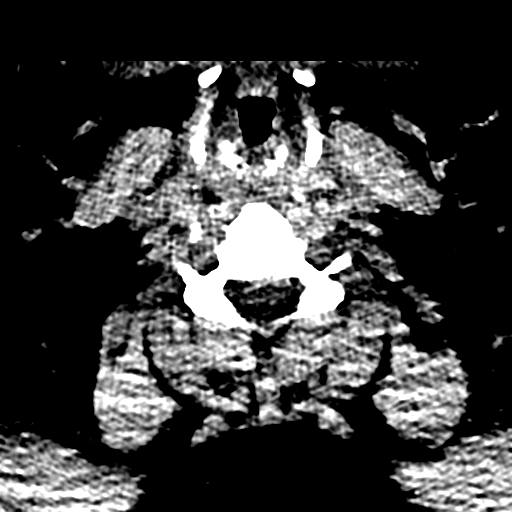
[im 43/75  brain]
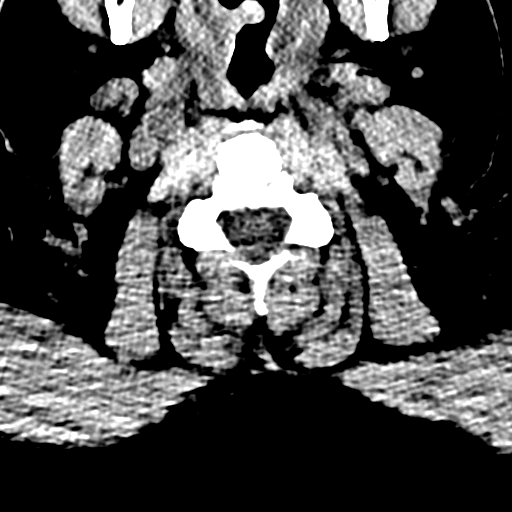
[im 50/75  brain]
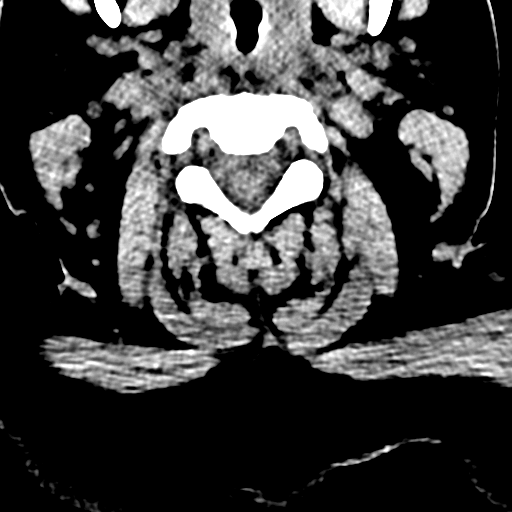

[Series 10: sagittals · sagittal · 0.31mm/px · 2 of 67 slices shown]
[im 23/67  brain]
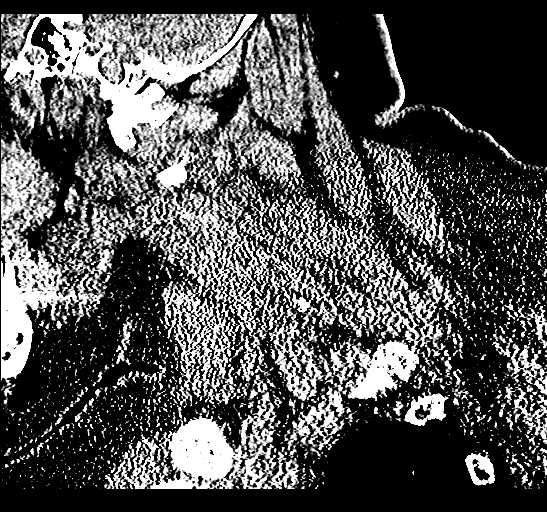
[im 45/67  brain]
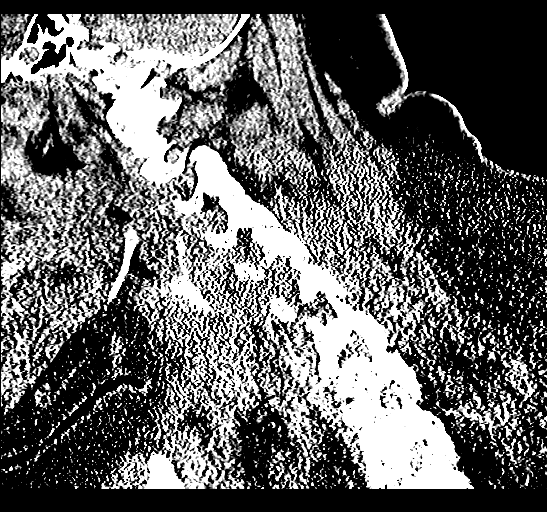

[17 of 47 positions shown; findings below may reference images not displayed]

FINDINGS: CT HEAD FINDINGS

Brain: No evidence of acute infarction, hemorrhage, hydrocephalus,
extra-axial collection or mass lesion/mass effect.

Vascular: No hyperdense vessel or unexpected calcification.

Skull: Normal. Negative for fracture or focal lesion.

Sinuses/Orbits: Minor ethmoid mucosal thickening. Other sinuses and
mastoids are clear. Orbits unremarkable.

Other: None.

CT CERVICAL SPINE FINDINGS

Alignment: Normal alignment. Kyphotic curvature noted appearing
positional. Facets are aligned.

Skull base and vertebrae: No acute fracture. No primary bone lesion
or focal pathologic process.

Soft tissues and spinal canal: No prevertebral fluid or swelling. No
visible canal hematoma.

Disc levels: No significant degenerative process or spondylosis.
Patent cervical foramina bilaterally. Preserved vertebral body
heights and disc spaces.

Upper chest: Negative.

Other: None.
IMPRESSION: Normal head CT without contrast.

No acute cervical spine osseous abnormality, fracture or
malalignment.
